# Patient Record
Sex: Male | Born: 2000 | Race: Black or African American | Hispanic: No | Marital: Single | Smoking: Never smoker
Health system: Southern US, Community
[De-identification: ages and names within clinical notes are randomized; demographics above are authoritative.]

## PROBLEM LIST (undated history)

## (undated) ENCOUNTER — Emergency Department (HOSPITAL_COMMUNITY): Admission: EM

## (undated) ENCOUNTER — Ambulatory Visit: Payer: No Typology Code available for payment source

## (undated) DIAGNOSIS — K589 Irritable bowel syndrome without diarrhea: Secondary | ICD-10-CM

## (undated) DIAGNOSIS — M87052 Idiopathic aseptic necrosis of left femur: Secondary | ICD-10-CM

## (undated) DIAGNOSIS — M2142 Flat foot [pes planus] (acquired), left foot: Secondary | ICD-10-CM

## (undated) DIAGNOSIS — R519 Headache, unspecified: Secondary | ICD-10-CM

## (undated) DIAGNOSIS — R569 Unspecified convulsions: Secondary | ICD-10-CM

## (undated) DIAGNOSIS — J45909 Unspecified asthma, uncomplicated: Secondary | ICD-10-CM

## (undated) DIAGNOSIS — K219 Gastro-esophageal reflux disease without esophagitis: Secondary | ICD-10-CM

## (undated) DIAGNOSIS — L309 Dermatitis, unspecified: Secondary | ICD-10-CM

## (undated) DIAGNOSIS — M2141 Flat foot [pes planus] (acquired), right foot: Secondary | ICD-10-CM

## (undated) DIAGNOSIS — J302 Other seasonal allergic rhinitis: Secondary | ICD-10-CM

## (undated) DIAGNOSIS — G43909 Migraine, unspecified, not intractable, without status migrainosus: Secondary | ICD-10-CM

## (undated) DIAGNOSIS — L83 Acanthosis nigricans: Secondary | ICD-10-CM

## (undated) DIAGNOSIS — D509 Iron deficiency anemia, unspecified: Secondary | ICD-10-CM

## (undated) DIAGNOSIS — M24273 Disorder of ligament, unspecified ankle: Secondary | ICD-10-CM

## (undated) DIAGNOSIS — E669 Obesity, unspecified: Secondary | ICD-10-CM

## (undated) HISTORY — DX: Irritable bowel syndrome, unspecified: K58.9

## (undated) HISTORY — DX: Dermatitis, unspecified: L30.9

## (undated) HISTORY — DX: Iron deficiency anemia, unspecified: D50.9

## (undated) HISTORY — PX: COLONOSCOPY: SHX174

## (undated) HISTORY — DX: Flat foot (pes planus) (acquired), right foot: M21.41

## (undated) HISTORY — PX: ADENOIDECTOMY: SUR15

## (undated) HISTORY — DX: Gastro-esophageal reflux disease without esophagitis: K21.9

## (undated) HISTORY — DX: Disorder of ligament, unspecified ankle: M24.273

## (undated) HISTORY — DX: Idiopathic aseptic necrosis of left femur: M87.052

## (undated) HISTORY — DX: Flat foot (pes planus) (acquired), left foot: M21.42

## (undated) HISTORY — DX: Acanthosis nigricans: L83

## (undated) HISTORY — DX: Obesity, unspecified: E66.9

## (undated) HISTORY — DX: Headache, unspecified: R51.9

## (undated) HISTORY — PX: TONSILLECTOMY: SUR1361

## (undated) HISTORY — DX: Migraine, unspecified, not intractable, without status migrainosus: G43.909

---
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
## 2020-01-02 ENCOUNTER — Ambulatory Visit (INDEPENDENT_AMBULATORY_CARE_PROVIDER_SITE_OTHER): Payer: Self-pay | Admitting: Gastroenterology

## 2020-01-02 ENCOUNTER — Encounter: Payer: Self-pay | Admitting: Gastroenterology

## 2020-01-02 VITALS — BP 112/62 | HR 80 | Ht 68.0 in | Wt 283.2 lb

## 2020-01-02 DIAGNOSIS — K580 Irritable bowel syndrome with diarrhea: Secondary | ICD-10-CM

## 2020-01-02 DIAGNOSIS — R140 Abdominal distension (gaseous): Secondary | ICD-10-CM

## 2020-01-02 MED ORDER — DICYCLOMINE HCL 10 MG PO CAPS: 20.0000 mg | ORAL_CAPSULE | Freq: Three times a day (TID) | ORAL | 3 refills | Status: DC

## 2020-01-02 NOTE — Progress Notes (Signed)
[facility] Progress Note  Chief Complaint: Chronic diarrhea  Subjective  History: Seen July 70 consultation with history of IBS-C evaluated and treated at [facility] in 2011 and again in 2016/17.  EGD and colonoscopy done at the most recent evaluation there, negative extensive upper GI and colonic biopsies.  Much of this year he has had diarrhea, which I felt might be evolution of his IBS.  He did not get much improvement with low-dose dicyclomine, so was recently increased.  He also gave me FMLA paperwork to be out of work for a couple of weeks due to the severity of symptoms with urgency and incontinence.  [name] is feeling better in the last few weeks after an increase of the dicyclomine to 20 mg 3 times a day before meals.  He has 2-3 BMs per day that are soft but formed.  There is occasional urgency, but no more accidents.  He also has less stress since quitting his job at an [employer] and getting a new job last week at a [employer] closer to his house. He denies rectal bleeding, his appetite is good and weight stable.  He has no abdominal pain.  ROS: Cardiovascular:  no chest pain Respiratory: no dyspnea  The patient's Past Medical, Family and Social History were reviewed and are on file in the EMR.  Objective:  Med list reviewed  Current Outpatient Medications:  .  albuterol (PROVENTIL HFA;VENTOLIN HFA) 108 (90 BASE) MCG/ACT inhaler, Inhale 1 puff into the lungs every 6 (six) hours as needed for wheezing. , Disp: , Rfl:  .  cetirizine (ZYRTEC) 1 MG/ML syrup, Take 10 mg by mouth at bedtime. , Disp: , Rfl:  .  fluticasone (FLONASE) 50 MCG/ACT nasal spray, Place 2 sprays into the nose daily., Disp: 16 g, Rfl: 1 .  hydrocortisone 2.5 % ointment, APPLY A THIN TO AFFECTED AREA TWICE A DAY FOR MAX OF 7 CONSECUTIVE DAYS EXTERNALLY, Disp: , Rfl:  .  ibuprofen (ADVIL) 200 MG tablet, Take 200 mg by mouth every 6 (six) hours as needed for headache., Disp: , Rfl:  .   polyethylene glycol powder (GLYCOLAX/MIRALAX) powder, 1/2 capful in 8 oz of liquid daily as needed to have 1-2 soft bm, Disp: 255 g, Rfl: 0 .  Rimegepant Sulfate (NURTEC) 75 MG TBDP, Take 75 mg by mouth daily as needed., Disp: 8 tablet, Rfl: 6 .  rizatriptan (MAXALT-MLT) 10 MG disintegrating tablet, Take 1 tablet (10 mg total) by mouth as needed for migraine. May repeat in 2 hours if needed, Disp: 9 tablet, Rfl: 11 .  sodium chloride (OCEAN) 0.65 % SOLN nasal spray, Place 1 spray into both nostrils as needed for congestion., Disp: 88 mL, Rfl: 0 .  topiramate (TOPAMAX) 50 MG tablet, Take 1 tablet (50 mg total) by mouth 2 (two) times daily., Disp: 60 tablet, Rfl: 12 .  dicyclomine (BENTYL) 10 MG capsule, Take 2 capsules (20 mg total) by mouth 3 (three) times daily before meals., Disp: 180 capsule, Rfl: 3   Vital signs in last 24 hrs: Vitals:   01/02/20 1410  BP: 112/62  Pulse: 80  SpO2: 97%    Physical Exam   HEENT: sclera anicteric, oral mucosa moist without lesions  Neck: supple, no thyromegaly, JVD or lymphadenopathy  Cardiac: RRR without murmurs, S1S2 heard, no peripheral edema  Pulm: clear to auscultation bilaterally, normal RR and effort noted  Abdomen: soft, obese, no tenderness, with active bowel sounds. No guarding or palpable hepatosplenomegaly.  Skin; warm and dry, no jaundice or rash  Labs:   ___________________________________________ Radiologic studies:   ____________________________________________ Other:   _____________________________________________ Assessment & Plan  Assessment: Encounter Diagnoses  Name Primary?  . Irritable bowel syndrome with diarrhea Yes  . Abdominal bloating    IBS most of this year, evolution of his previous functional bowel disorder.  Seems to have been at least in part triggered by some stressors.  Doing better lately on increased dose of dicyclomine.  Previous negative testing for IBD and celiac sprue.   Plan: Continue  current dose of dicyclomine, see me in 3 months. Call sooner if necessary.  If not doing well with increased diarrhea, development of abdominal pain or certainly rectal bleeding, then proceed with colonoscopy to see if IBD may have developed.   20 minutes were spent on this encounter (including chart review, history/exam, counseling/coordination of care, and documentation)  Nelida Meuse III

## 2020-01-02 NOTE — Patient Instructions (Signed)
If you are age 19 or older, your body mass index should be between 23-30. Your Body mass index is 43.06 kg/m. If this is out of the aforementioned range listed, please consider follow up with your Primary Care Provider.  If you are age 67 or younger, your body mass index should be between 19-25. Your Body mass index is 43.06 kg/m. If this is out of the aformentioned range listed, please consider follow up with your Primary Care Provider.   It was a pleasure to see you today!  Dr. Loletha Carrow

## 2020-01-07 ENCOUNTER — Encounter: Payer: Self-pay | Admitting: *Deleted

## 2020-01-09 ENCOUNTER — Ambulatory Visit: Payer: Medicaid Other | Admitting: Diagnostic Neuroimaging

## 2020-01-09 ENCOUNTER — Other Ambulatory Visit: Payer: Self-pay

## 2020-01-09 ENCOUNTER — Encounter: Payer: Self-pay | Admitting: Diagnostic Neuroimaging

## 2020-01-09 VITALS — BP 126/79 | HR 81 | Ht 68.0 in | Wt 288.6 lb

## 2020-01-09 DIAGNOSIS — R200 Anesthesia of skin: Secondary | ICD-10-CM
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
## 2020-01-14 ENCOUNTER — Telehealth: Payer: Self-pay | Admitting: Diagnostic Neuroimaging

## 2020-01-14 NOTE — Telephone Encounter (Signed)
[facility] pending.

## 2020-01-16 NOTE — Telephone Encounter (Signed)
I checked the status with RAD MD. It is still pending.

## 2020-01-17 NOTE — Telephone Encounter (Signed)
[facility] [name]: [id number] (exp. 01/14/20 to 02/13/20) order faxed to [facility] because they are who in network with the insurance. They will reach out to the patient to schedule.

## 2020-01-28 ENCOUNTER — Institutional Professional Consult (permissible substitution): Payer: No Typology Code available for payment source | Admitting: Diagnostic Neuroimaging

## 2020-03-30 ENCOUNTER — Other Ambulatory Visit: Payer: Self-pay | Admitting: Gastroenterology

## 2020-03-31 ENCOUNTER — Telehealth: Payer: Self-pay | Admitting: Gastroenterology

## 2020-03-31 NOTE — Telephone Encounter (Signed)
Pt needs Dicyclomine refill, call in at [facility]

## 2020-04-01 MED ORDER — DICYCLOMINE HCL 10 MG PO CAPS: 20.0000 mg | ORAL_CAPSULE | Freq: Three times a day (TID) | ORAL | 3 refills | Status: DC

## 2020-04-01 NOTE — Telephone Encounter (Signed)
Refills have been seen as requested

## 2020-05-01 ENCOUNTER — Encounter: Payer: Self-pay | Admitting: Family Medicine

## 2020-05-01 ENCOUNTER — Ambulatory Visit: Payer: Medicaid Other | Admitting: Family Medicine
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
## 2020-05-01 NOTE — Patient Instructions (Incomplete)
Below is our plan:  We will ***  Please make sure you are staying well hydrated. I recommend 50-60 ounces daily. Well balanced diet and regular exercise encouraged.    Please continue follow up with care team as directed.   Follow up with *** in ***  You may receive a survey regarding today's visit. I encourage you to leave honest feed back as I do use this information to improve patient care. Thank you for seeing me today!      Migraine Headache A migraine headache is a very strong throbbing pain on one side or both sides of your head. This type of headache can also cause other symptoms. It can last from 4 hours to 3 days. Talk with your doctor about what things may bring on (trigger) this condition. What are the causes? The exact cause of this condition is not known. This condition may be triggered or caused by:  Drinking alcohol.  Smoking.  Taking medicines, such as: ? Medicine used to treat chest pain (nitroglycerin). ? Birth control pills. ? Estrogen. ? Some blood pressure medicines.  Eating or drinking certain products.  Doing physical activity. Other things that may trigger a migraine headache include:  Having a menstrual period.  Pregnancy.  Hunger.  Stress.  Not getting enough sleep or getting too much sleep.  Weather changes.  Tiredness (fatigue). What increases the risk?  Being 25-55 years old.  Being male.  Having a family history of migraine headaches.  Being Caucasian.  Having depression or anxiety.  Being very overweight. What are the signs or symptoms?  A throbbing pain. This pain may: ? Happen in any area of the head, such as on one side or both sides. ? Make it hard to do daily activities. ? Get worse with physical activity. ? Get worse around bright lights or loud noises.  Other symptoms may include: ? Feeling sick to your stomach (nauseous). ? Vomiting. ? Dizziness. ? Being sensitive to bright lights, loud noises, or  smells.  Before you get a migraine headache, you may get warning signs (an aura). An aura may include: ? Seeing flashing lights or having blind spots. ? Seeing bright spots, halos, or zigzag lines. ? Having tunnel vision or blurred vision. ? Having numbness or a tingling feeling. ? Having trouble talking. ? Having weak muscles.  Some people have symptoms after a migraine headache (postdromal phase), such as: ? Tiredness. ? Trouble thinking (concentrating). How is this treated?  Taking medicines that: ? Relieve pain. ? Relieve the feeling of being sick to your stomach. ? Prevent migraine headaches.  Treatment may also include: ? Having acupuncture. ? Avoiding foods that bring on migraine headaches. ? Learning ways to control your body functions (biofeedback). ? Therapy to help you know and deal with negative thoughts (cognitive behavioral therapy). Follow these instructions at home: Medicines  Take over-the-counter and prescription medicines only as told by your doctor.  Ask your doctor if the medicine prescribed to you: ? Requires you to avoid driving or using heavy machinery. ? Can cause trouble pooping (constipation). You may need to take these steps to prevent or treat trouble pooping:  Drink enough fluid to keep your pee (urine) pale yellow.  Take over-the-counter or prescription medicines.  Eat foods that are high in fiber. These include beans, whole grains, and fresh fruits and vegetables.  Limit foods that are high in fat and sugar. These include fried or sweet foods. Lifestyle  Do not drink alcohol.  Do   not use any products that contain nicotine or tobacco, such as cigarettes, e-cigarettes, and chewing tobacco. If you need help quitting, ask your doctor.  Get at least 8 hours of sleep every night.  Limit and deal with stress. General instructions  Keep a journal to find out what may bring on your migraine headaches. For example, write down: ? What you eat  and drink. ? How much sleep you get. ? Any change in what you eat or drink. ? Any change in your medicines.  If you have a migraine headache: ? Avoid things that make your symptoms worse, such as bright lights. ? It may help to lie down in a dark, quiet room. ? Do not drive or use heavy machinery. ? Ask your doctor what activities are safe for you.  Keep all follow-up visits as told by your doctor. This is important.      Contact a doctor if:  You get a migraine headache that is different or worse than others you have had.  You have more than 15 headache days in one month. Get help right away if:  Your migraine headache gets very bad.  Your migraine headache lasts longer than 72 hours.  You have a fever.  You have a stiff neck.  You have trouble seeing.  Your muscles feel weak or like you cannot control them.  You start to lose your balance a lot.  You start to have trouble walking.  You pass out (faint).  You have a seizure. Summary  A migraine headache is a very strong throbbing pain on one side or both sides of your head. These headaches can also cause other symptoms.  This condition may be treated with medicines and changes to your lifestyle.  Keep a journal to find out what may bring on your migraine headaches.  Contact a doctor if you get a migraine headache that is different or worse than others you have had.  Contact your doctor if you have more than 15 headache days in a month. This information is not intended to replace advice given to you by your health care provider. Make sure you discuss any questions you have with your health care provider. Document Revised: 07/28/2018 Document Reviewed: 05/18/2018 Elsevier Patient Education  [place].

## 2020-11-05 ENCOUNTER — Other Ambulatory Visit: Payer: Self-pay | Admitting: Gastroenterology

## 2021-01-07 ENCOUNTER — Telehealth: Payer: Self-pay | Admitting: Gastroenterology

## 2021-01-07 ENCOUNTER — Other Ambulatory Visit: Payer: Self-pay | Admitting: Gastroenterology

## 2021-01-07 NOTE — Telephone Encounter (Signed)
Refill has been given, must keep her follow up for additional refills

## 2021-01-07 NOTE — Telephone Encounter (Signed)
Inbound call from patient mother. Need medication refill for dicyclomine to [facility]. Patient have an appt scheduled for 10/12.

## 2021-01-28 ENCOUNTER — Ambulatory Visit (INDEPENDENT_AMBULATORY_CARE_PROVIDER_SITE_OTHER): Payer: Medicaid Other | Admitting: Gastroenterology

## 2021-01-28 ENCOUNTER — Encounter: Payer: Self-pay | Admitting: Gastroenterology

## 2021-01-28 VITALS — BP 120/84 | HR 66 | Ht 69.0 in | Wt 289.0 lb

## 2021-01-28 DIAGNOSIS — K580 Irritable bowel syndrome with diarrhea: Secondary | ICD-10-CM | POA: Diagnosis not present

## 2021-01-28 DIAGNOSIS — K529 Noninfective gastroenteritis and colitis, unspecified: Secondary | ICD-10-CM

## 2021-01-28 DIAGNOSIS — R140 Abdominal distension (gaseous): Secondary | ICD-10-CM

## 2021-01-28 NOTE — Progress Notes (Signed)
[facility] Progress Note  Chief Complaint: Chronic diarrhea and abdominal  Subjective  History:   IBS-D, last seen Sept 2021 on dicyclomine 20 mg TID prn [place] feels about the same as before, medicine helps him most days but he still has bad days with cramps and diarrhea.  He denies rectal bleeding, appetite good and weight stable.  No joint swelling, rash or painful eye redness. He had an endoscopic work-up years ago at [facility], but was having more constipation at that time.  ROS: Cardiovascular:  no chest pain Respiratory: no dyspnea Allergies Headaches Remainder of systems negative except as above The patient's Past Medical, Family and Social History were reviewed and are on file in the EMR.  Objective:  Med list reviewed  Current Outpatient Medications:    albuterol (PROVENTIL HFA;VENTOLIN HFA) 108 (90 BASE) MCG/ACT inhaler, Inhale 1 puff into the lungs every 6 (six) hours as needed for wheezing. , Disp: , Rfl:    cetirizine (ZYRTEC) 1 MG/ML syrup, Take 10 mg by mouth at bedtime. , Disp: , Rfl:    dicyclomine (BENTYL) 10 MG capsule, TAKE 2 CAPSULES (20 MG TOTAL) BY MOUTH 3 (THREE) TIMES DAILY BEFORE MEALS., Disp: 180 capsule, Rfl: 1   fluticasone (FLONASE) 50 MCG/ACT nasal spray, Place 2 sprays into the nose daily., Disp: 16 g, Rfl: 1   hydrocortisone 2.5 % ointment, APPLY A THIN TO AFFECTED AREA TWICE A DAY FOR MAX OF 7 CONSECUTIVE DAYS EXTERNALLY, Disp: , Rfl:    ibuprofen (ADVIL) 200 MG tablet, Take 200 mg by mouth every 6 (six) hours as needed for headache., Disp: , Rfl:    polyethylene glycol powder (GLYCOLAX/MIRALAX) powder, 1/2 capful in 8 oz of liquid daily as needed to have 1-2 soft bm, Disp: 255 g, Rfl: 0   Rimegepant Sulfate (NURTEC) 75 MG TBDP, Take 75 mg by mouth daily as needed., Disp: 8 tablet, Rfl: 6   rizatriptan (MAXALT-MLT) 10 MG disintegrating tablet, Take 1 tablet (10 mg total) by mouth as needed for migraine. May repeat in 2 hours if  needed, Disp: 9 tablet, Rfl: 11   sodium chloride (OCEAN) 0.65 % SOLN nasal spray, Place 1 spray into both nostrils as needed for congestion., Disp: 88 mL, Rfl: 0   topiramate (TOPAMAX) 50 MG tablet, Take 1 tablet (50 mg total) by mouth 2 (two) times daily., Disp: 60 tablet, Rfl: 12   Vital signs in last 24 hrs: Vitals:   01/28/21 1049  BP: 120/84  Pulse: 66  SpO2: 98%   Wt Readings from Last 3 Encounters:  01/28/21 289 lb (131.1 kg)  01/09/20 288 lb 9.6 oz (130.9 kg) (>99 %, Z= 2.93)*  01/02/20 283 lb 3.2 oz (128.5 kg) (>99 %, Z= 2.87)*   * Growth percentiles are based on CDC (Boys, 2-20 Years) data.    Physical Exam  Well-appearing HEENT: sclera anicteric, oral mucosa moist without lesions Neck: supple, no thyromegaly, JVD or lymphadenopathy Cardiac: RRR without murmurs, S1S2 heard, no peripheral edema Pulm: clear to auscultation bilaterally, normal RR and effort noted Abdomen: soft, no tenderness, with active bowel sounds. No guarding or palpable hepatosplenomegaly. Skin; warm and dry, no jaundice or rash  Labs:   ___________________________________________ Radiologic studies:   ____________________________________________ Other:   _____________________________________________ Assessment & Plan  Assessment: Encounter Diagnoses  Name Primary?   Irritable bowel syndrome with diarrhea Yes   Abdominal bloating    Chronic diarrhea    Abdominal bloating, pain that is primarily lower and chronic  constipation, all of which is still most likely IBS.  Years ago he had constipation, then 2 to 3 years ago with change to diarrhea. While antispasmodic therapy might not control all symptoms, given his persistent diarrhea and age we must consider IBD.  No evidence of ileitis on April 2021 CT abdomen pelvis  As such, I recommended he have a colonoscopy to rule out Crohn's or colitis.  He was agreeable after discussion of procedure and risks.  The benefits and risks of the  planned procedure were described in detail with the patient or (when appropriate) their health care proxy.  Risks were outlined as including, but not limited to, bleeding, infection, perforation, adverse medication reaction leading to cardiac or pulmonary decompensation, pancreatitis (if ERCP).  The limitation of incomplete mucosal visualization was also discussed.  No guarantees or warranties were given.  Continue dicyclomine 20 mg 3 times daily as needed in the meantime.    [name]

## 2021-01-28 NOTE — Patient Instructions (Signed)
If you are age 20 or older, your body mass index should be between 23-30. Your Body mass index is 42.68 kg/m. If this is out of the aforementioned range listed, please consider follow up with your Primary Care Provider.  If you are age 80 or younger, your body mass index should be between 19-25. Your Body mass index is 42.68 kg/m. If this is out of the aformentioned range listed, please consider follow up with your Primary Care Provider.   __________________________________________________________  The [facility] providers would like to encourage you to use [facility] to communicate with providers for non-urgent requests or questions.  Due to long hold times on the telephone, sending your provider a message by [facility] may be a faster and more efficient way to get a response.  Please allow 48 business hours for a response.  Please remember that this is for non-urgent requests.   It has been recommended to you by your physician that you have a(n) Colonoscopy completed. Per your request, we did not schedule the procedure(s) today. Please contact our office at [phone] should you decide to have the procedure completed. You will be scheduled for a pre-visit and procedure at that time.  It was a pleasure to see you today!  Thank you for trusting me with your gastrointestinal care!

## 2021-01-29 ENCOUNTER — Other Ambulatory Visit: Payer: Self-pay | Admitting: Gastroenterology

## 2021-02-03 ENCOUNTER — Other Ambulatory Visit: Payer: Self-pay

## 2021-02-03 MED ORDER — DICYCLOMINE HCL 10 MG PO CAPS: 20.0000 mg | ORAL_CAPSULE | Freq: Three times a day (TID) | ORAL | 1 refills | Status: DC

## 2021-02-03 NOTE — Telephone Encounter (Signed)
Resent the Rx from 01-28-2021.

## 2021-02-03 NOTE — Telephone Encounter (Signed)
Patient is requesting another refill of Dicyclomine.

## 2021-02-09 ENCOUNTER — Other Ambulatory Visit: Payer: Self-pay

## 2021-02-09 ENCOUNTER — Ambulatory Visit (AMBULATORY_SURGERY_CENTER): Payer: Medicaid Other

## 2021-02-09 VITALS — Ht 69.0 in | Wt 280.0 lb

## 2021-02-09 DIAGNOSIS — Z1211 Encounter for screening for malignant neoplasm of colon: Secondary | ICD-10-CM

## 2021-02-09 MED ORDER — NA SULFATE-K SULFATE-MG SULF 17.5-3.13-1.6 GM/177ML PO SOLN: 1.0000 | Freq: Once | ORAL | 0 refills | Status: AC

## 2021-02-09 NOTE — Progress Notes (Signed)
Pre visit completed via phone call; Patient verified name, DOB, and address; No egg or soy allergy known to patient  No issues known to pt with past sedation with any surgeries or procedures Patient denies ever being told they had issues or difficulty with intubation  No FH of Malignant Hyperthermia Pt is not on diet pills Pt is not on home 02  Pt is not on blood thinners  Pt denies issues with constipation at this time- hx of constipation -pt reports using Miralax as needed No A fib or A flutter Pt is fully vaccinated for Covid x 2; NO PA's for preps discussed with pt in PV today  Discussed with pt there will be an out-of-pocket cost for prep and that varies from $0 to 70 +  dollars - pt verbalized understanding  Due to the COVID-19 pandemic we are asking patients to follow certain guidelines in PV and the [facility]   Pt aware of COVID protocols and [facility] guidelines

## 2021-02-23 ENCOUNTER — Other Ambulatory Visit: Payer: Self-pay

## 2021-02-23 ENCOUNTER — Encounter: Payer: Self-pay | Admitting: Gastroenterology

## 2021-02-23 ENCOUNTER — Ambulatory Visit (AMBULATORY_SURGERY_CENTER): Payer: Self-pay | Admitting: Gastroenterology

## 2021-02-23 VITALS — BP 109/64 | HR 75 | Temp 97.1°F | Resp 12 | Ht 69.0 in | Wt 280.0 lb

## 2021-02-23 DIAGNOSIS — K5140 Inflammatory polyps of colon without complications: Secondary | ICD-10-CM

## 2021-02-23 DIAGNOSIS — K529 Noninfective gastroenteritis and colitis, unspecified: Secondary | ICD-10-CM

## 2021-02-23 DIAGNOSIS — R1030 Lower abdominal pain, unspecified: Secondary | ICD-10-CM

## 2021-02-23 DIAGNOSIS — D125 Benign neoplasm of sigmoid colon: Secondary | ICD-10-CM

## 2021-02-23 MED ORDER — SODIUM CHLORIDE 0.9 % IV SOLN
500.0000 mL | Freq: Once | INTRAVENOUS | Status: DC
Start: 2021-02-23 — End: 2021-02-23

## 2021-02-23 NOTE — Progress Notes (Signed)
PT taken to PACU. Monitors in place. VSS. Report given to RN. 

## 2021-02-23 NOTE — Progress Notes (Signed)
Pt's states no medical or surgical changes since previsit or office visit. VS by DT. °

## 2021-02-23 NOTE — Op Note (Signed)
[facility] Patient Name: [name] Procedure Date: 02/23/2021 3:09 PM MRN: [id number] Endoscopist: Mallie Mussel L. Loletha Carrow , MD Age: 20 Referring MD:  Date of Birth: 06-22-2000 Gender: Male Account #: [id number] Procedure:                Colonoscopy Indications:              Lower abdominal pain, Chronic diarrhea                           clinical suspicion IBS, on dicyclomine. exam to r/o                            IBD Medicines:                Monitored Anesthesia Care Procedure:                Pre-Anesthesia Assessment:                           - Prior to the procedure, a History and Physical                            was performed, and patient medications and                            allergies were reviewed. The patient's tolerance of                            previous anesthesia was also reviewed. The risks                            and benefits of the procedure and the sedation                            options and risks were discussed with the patient.                            All questions were answered, and informed consent                            was obtained. Prior Anticoagulants: The patient has                            taken no previous anticoagulant or antiplatelet                            agents. ASA Grade Assessment: III - A patient with                            severe systemic disease. After reviewing the risks                            and benefits, the patient was deemed in  satisfactory condition to undergo the procedure.                           After obtaining informed consent, the colonoscope                            was passed under direct vision. Throughout the                            procedure, the patient's blood pressure, pulse, and                            oxygen saturations were monitored continuously. The                            #[id number] was introduced through the anus                             and advanced to the the terminal ileum, with                            identification of the appendiceal orifice and IC                            valve. The colonoscopy was performed without                            difficulty. The patient tolerated the procedure                            well. The quality of the bowel preparation was                            good. The terminal ileum, ileocecal valve,                            appendiceal orifice, and rectum were photographed. Scope In: 3:39:02 PM Scope Out: 3:51:36 PM Scope Withdrawal Time: 0 hours 10 minutes 2 seconds  Total Procedure Duration: 0 hours 12 minutes 34 seconds  Findings:                 The perianal and digital rectal examinations were                            normal.                           The terminal ileum appeared normal.                           Normal mucosa was found in the entire colon.                            Biopsies for histology were taken with a cold  forceps from the right colon and left colon for                            evaluation of microscopic colitis.                           A 6-8 mm polyp was found in the sigmoid colon. The                            polyp was pedunculated. The polyp was removed with                            a hot snare. Resection and retrieval were complete.                            (probable inflammatory polyp by appearance)                           The exam was otherwise without abnormality on                            direct and retroflexion views. Complications:            No immediate complications. Estimated Blood Loss:     Estimated blood loss was minimal. Impression:               - The examined portion of the ileum was normal.                           - Normal mucosa in the entire examined colon.                            Biopsied.                           - One 6-6 mm polyp in the sigmoid colon, removed                             with a hot snare. Resected and retrieved.                           - The examination was otherwise normal on direct                            and retroflexion views. Recommendation:           - Patient has a contact number available for                            emergencies. The signs and symptoms of potential                            delayed complications were discussed with the  patient. Return to normal activities tomorrow.                            Written discharge instructions were provided to the                            patient.                           - Resume previous diet.                           - Continue present medications.                           - Await pathology results.                           - Return to my office in 3 months for f/u of IBS. Arista Kettlewell L. Loletha Carrow, MD 02/23/2021 3:56:27 PM This report has been signed electronically.

## 2021-02-23 NOTE — Patient Instructions (Signed)
Handout on polyps given. ° °YOU HAD AN ENDOSCOPIC PROCEDURE TODAY AT THE [facility]:   Refer to the procedure report that was given to you for any specific questions about what was found during the examination.  If the procedure report does not answer your questions, please call your gastroenterologist to clarify.  If you requested that your care partner not be given the details of your procedure findings, then the procedure report has been included in a sealed envelope for you to review at your convenience later. ° °YOU SHOULD EXPECT: Some feelings of bloating in the abdomen. Passage of more gas than usual.  Walking can help get rid of the air that was put into your GI tract during the procedure and reduce the bloating. If you had a lower endoscopy (such as a colonoscopy or flexible sigmoidoscopy) you may notice spotting of blood in your stool or on the toilet paper. If you underwent a bowel prep for your procedure, you may not have a normal bowel movement for a few days. ° °Please Note:  You might notice some irritation and congestion in your nose or some drainage.  This is from the oxygen used during your procedure.  There is no need for concern and it should clear up in a day or so. ° °SYMPTOMS TO REPORT IMMEDIATELY: ° °Following lower endoscopy (colonoscopy or flexible sigmoidoscopy): ° Excessive amounts of blood in the stool ° Significant tenderness or worsening of abdominal pains ° Swelling of the abdomen that is new, acute ° Fever of 100°F or higher ° °For urgent or emergent issues, a gastroenterologist can be reached at any hour by calling [phone]. °Do not use MyChart messaging for urgent concerns.  ° ° °DIET:  We do recommend a small meal at first, but then you may proceed to your regular diet.  Drink plenty of fluids but you should avoid alcoholic beverages for 24 hours. ° °ACTIVITY:  You should plan to take it easy for the rest of today and you should NOT DRIVE or use heavy machinery  until tomorrow (because of the sedation medicines used during the test).   ° °FOLLOW UP: °Our staff will call the number listed on your records 48-72 hours following your procedure to check on you and address any questions or concerns that you may have regarding the information given to you following your procedure. If we do not reach you, we will leave a message.  We will attempt to reach you two times.  During this call, we will ask if you have developed any symptoms of COVID 19. If you develop any symptoms (ie: fever, flu-like symptoms, shortness of breath, cough etc.) before then, please call [phone].  If you test positive for Covid 19 in the 2 weeks post procedure, please call and report this information to us.   ° °If any biopsies were taken you will be contacted by phone or by letter within the next 1-3 weeks.  Please call us at [phone] if you have not heard about the biopsies in 3 weeks.  ° ° °SIGNATURES/CONFIDENTIALITY: °You and/or your care partner have signed paperwork which will be entered into your electronic medical record.  These signatures attest to the fact that that the information above on your After Visit Summary has been reviewed and is understood.  Full responsibility of the confidentiality of this discharge information lies with you and/or your care-partner.  °

## 2021-02-23 NOTE — Progress Notes (Signed)
No changes to clinical history since GI office visit on 01/28/21.  The patient is appropriate for an endoscopic procedure in the ambulatory setting.

## 2021-02-24 ENCOUNTER — Telehealth: Payer: Self-pay

## 2021-02-24 NOTE — Telephone Encounter (Signed)
Per 02/23/21 procedure report - Return to my office in 64-month for f/u up of IBS  February schedule is currently not available. 35-month office recall in epic.

## 2021-02-25 ENCOUNTER — Telehealth: Payer: Self-pay

## 2021-02-25 ENCOUNTER — Other Ambulatory Visit: Payer: Self-pay | Admitting: Gastroenterology

## 2021-02-25 NOTE — Telephone Encounter (Signed)
No answer, left message to call back later today, B.Peyson Delao RN. 

## 2021-03-02 ENCOUNTER — Encounter: Payer: Self-pay | Admitting: Gastroenterology

## 2021-03-29 ENCOUNTER — Other Ambulatory Visit: Payer: Self-pay | Admitting: Gastroenterology

## 2021-04-27 ENCOUNTER — Other Ambulatory Visit: Payer: Self-pay | Admitting: Gastroenterology

## 2021-05-06 ENCOUNTER — Encounter: Payer: Self-pay | Admitting: Gastroenterology

## 2021-05-23 ENCOUNTER — Other Ambulatory Visit: Payer: Self-pay | Admitting: Gastroenterology
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
## 2021-06-19 ENCOUNTER — Other Ambulatory Visit: Payer: Self-pay | Admitting: Gastroenterology

## 2021-07-13 ENCOUNTER — Other Ambulatory Visit: Payer: Self-pay | Admitting: Gastroenterology

## 2021-08-24 ENCOUNTER — Other Ambulatory Visit: Payer: Self-pay | Admitting: Gastroenterology

## 2021-08-25 ENCOUNTER — Other Ambulatory Visit: Payer: Self-pay | Admitting: Gastroenterology

## 2021-08-25 DIAGNOSIS — K580 Irritable bowel syndrome with diarrhea: Secondary | ICD-10-CM

## 2021-08-25 MED ORDER — DICYCLOMINE HCL 20 MG PO TABS: 20.0000 mg | ORAL_TABLET | Freq: Three times a day (TID) | ORAL | 1 refills | Status: DC

## 2021-08-27 ENCOUNTER — Other Ambulatory Visit: Payer: Self-pay | Admitting: Gastroenterology

## 2022-04-21 ENCOUNTER — Other Ambulatory Visit: Payer: Self-pay | Admitting: Gastroenterology

## 2022-04-21 NOTE — Telephone Encounter (Signed)
Pharmacy is requesting refill for  bentyl for this patient. You saw him last time on 02/07/21. I called the patient and scheduled his appt with you on 05/04/22. Patient stating he is out of bentyl and requesting to sent refill.  Please advise. Thank you!

## 2022-05-04 ENCOUNTER — Ambulatory Visit: Payer: Medicaid Other | Admitting: Gastroenterology

## 2022-05-04 ENCOUNTER — Encounter: Payer: Self-pay | Admitting: Gastroenterology

## 2022-05-04 ENCOUNTER — Other Ambulatory Visit: Payer: Medicaid Other

## 2022-05-04 VITALS — BP 120/82 | HR 94 | Ht 69.5 in | Wt 246.4 lb

## 2022-05-04 DIAGNOSIS — K580 Irritable bowel syndrome with diarrhea: Secondary | ICD-10-CM

## 2022-05-04 MED ORDER — DICYCLOMINE HCL 20 MG PO TABS: 20.0000 mg | ORAL_TABLET | Freq: Three times a day (TID) | ORAL | 1 refills | Status: DC

## 2022-05-04 NOTE — Progress Notes (Signed)
     [facility] Progress Note  Chief Complaint: IBS-D  Subjective  History: Summary of GI issues: IBS with lower abdominal pain, bloating and diarrhea.  Treated with dicyclomine.  Colonoscopy with normal colon biopsies November 2022.  Damoyne continues to take dicyclomine 20 mg 3 times daily but generally controls cramps and diarrhea.  However, some days he still has semiformed to loose nonbloody stool and cannot seem to attribute it to any particular foods or other triggers.  (15 minutes late for today's visit) ROS: Cardiovascular:  no chest pain Respiratory: no dyspnea  The patient's Past Medical, Family and Social History were reviewed and are on file in the EMR.  Objective:  Med list reviewed  Current Outpatient Medications:    albuterol (PROVENTIL HFA;VENTOLIN HFA) 108 (90 BASE) MCG/ACT inhaler, Inhale 1 puff into the lungs every 6 (six) hours as needed for wheezing. , Disp: , Rfl:    cetirizine (ZYRTEC) 10 MG tablet, Take 1 tablet by mouth daily at 6 (six) AM., Disp: , Rfl:    fluticasone (FLONASE) 50 MCG/ACT nasal spray, Place 2 sprays into the nose daily., Disp: 16 g, Rfl: 1   hydrocortisone 2.5 % ointment, daily as needed., Disp: , Rfl:    ibuprofen (ADVIL) 200 MG tablet, Take 200 mg by mouth every 6 (six) hours as needed for headache., Disp: , Rfl:    rizatriptan (MAXALT-MLT) 10 MG disintegrating tablet, Take 1 tablet (10 mg total) by mouth as needed for migraine. May repeat in 2 hours if needed, Disp: 9 tablet, Rfl: 11   sodium chloride (OCEAN) 0.65 % SOLN nasal spray, Place 1 spray into both nostrils as needed for congestion., Disp: 88 mL, Rfl: 0   dicyclomine (BENTYL) 20 MG tablet, Take 1 tablet (20 mg total) by mouth 3 (three) times daily before meals., Disp: 270 tablet, Rfl: 1   Vital signs in last 24 hrs: Vitals:   05/04/22 0836  BP: 120/82  Pulse: 94  SpO2: 98%   Wt Readings from Last 3 Encounters:  05/04/22 246 lb 6 oz (111.8 kg)  02/23/21 280 lb  (127 kg)  02/09/21 280 lb (127 kg)    Physical Exam  Well-appearing  Cardiac: Regular without appreciable murmur,  no peripheral edema Pulm: clear to auscultation bilaterally, normal RR and effort noted Abdomen: soft, no tenderness, with active bowel sounds. No guarding or palpable hepatosplenomegaly.  Labs:   ___________________________________________ Radiologic studies:   ____________________________________________ Other:   _____________________________________________ Assessment & Plan  Assessment: Encounter Diagnosis  Name Primary?   Irritable bowel syndrome with diarrhea Yes   Intermittent breakthrough symptoms.  Would like to try hyoscyamine instead but Medicaid does not cover it.  Recommend using as needed Imodium in addition to the dicyclomine   Plan: Celiac antibodies since not done previously  20 minutes were spent on this encounter (including chart review, history/exam, counseling/coordination of care, and documentation) > 50% of that time was spent on counseling and coordination of care.   Nelida Meuse III

## 2022-05-04 NOTE — Patient Instructions (Signed)
_______________________________________________________  If your blood pressure at your visit was 140/90 or greater, please contact your primary care physician to follow up on this.  _______________________________________________________  If you are age 23 or older, your body mass index should be between 23-30. Your Body mass index is 35.86 kg/m. If this is out of the aforementioned range listed, please consider follow up with your Primary Care Provider.  If you are age 2 or younger, your body mass index should be between 19-25. Your Body mass index is 35.86 kg/m. If this is out of the aformentioned range listed, please consider follow up with your Primary Care Provider.   ________________________________________________________  The [facility] providers would like to encourage you to use [facility] to communicate with providers for non-urgent requests or questions.  Due to long hold times on the telephone, sending your provider a message by [facility] may be a faster and more efficient way to get a response.  Please allow 48 business hours for a response.  Please remember that this is for non-urgent requests.  _______________________________________________________  Your provider has requested that you go to the basement level for lab work before leaving today. Press "B" on the elevator. The lab is located at the first door on the left as you exit the elevator.  Due to recent changes in healthcare laws, you may see the results of your imaging and laboratory studies on MyChart before your provider has had a chance to review them.  We understand that in some cases there may be results that are confusing or concerning to you. Not all laboratory results come back in the same time frame and the provider may be waiting for multiple results in order to interpret others.  Please give [place] 48 hours in order for your provider to thoroughly review all the results before contacting the office for clarification of  your results.   It was a pleasure to see you today!  Thank you for trusting me with your gastrointestinal care!

## 2022-05-05 LAB — IGA: Immunoglobulin A: 12 mg/dL — ABNORMAL LOW (ref 47–310)

## 2022-05-05 LAB — TISSUE TRANSGLUTAMINASE, IGA

## 2022-05-11 ENCOUNTER — Other Ambulatory Visit: Payer: Self-pay

## 2022-05-11 DIAGNOSIS — K580 Irritable bowel syndrome with diarrhea: Secondary | ICD-10-CM

## 2022-05-12 NOTE — Addendum Note (Signed)
Addended by: Yevette Edwards on: 05/12/2022 09:14 AM   Modules accepted: Orders

## 2022-11-27 ENCOUNTER — Other Ambulatory Visit: Payer: Self-pay | Admitting: Gastroenterology

## 2023-02-13 ENCOUNTER — Ambulatory Visit
Admission: EM | Admit: 2023-02-13 | Discharge: 2023-02-13 | Disposition: A | Discharge status: Home / Self Care | Payer: Medicaid Other | Attending: Internal Medicine | Admitting: Internal Medicine

## 2023-02-13 DIAGNOSIS — J4530 Mild persistent asthma, uncomplicated: Secondary | ICD-10-CM

## 2023-02-13 DIAGNOSIS — J40 Bronchitis, not specified as acute or chronic: Secondary | ICD-10-CM

## 2023-02-13 DIAGNOSIS — J329 Chronic sinusitis, unspecified: Secondary | ICD-10-CM

## 2023-02-13 DIAGNOSIS — J309 Allergic rhinitis, unspecified: Secondary | ICD-10-CM

## 2023-02-13 MED ORDER — PREDNISONE 20 MG PO TABS: ORAL_TABLET | ORAL | 0 refills | Status: AC

## 2023-02-13 MED ORDER — DOXYCYCLINE HYCLATE 100 MG PO CAPS: 100.0000 mg | ORAL_CAPSULE | Freq: Two times a day (BID) | ORAL | 0 refills | Status: AC

## 2023-02-13 MED ORDER — ALBUTEROL SULFATE HFA 108 (90 BASE) MCG/ACT IN AERS: 1.0000 | INHALATION_SPRAY | Freq: Four times a day (QID) | RESPIRATORY_TRACT | 0 refills | Status: AC | PRN

## 2023-02-13 MED ORDER — PROMETHAZINE-DM 6.25-15 MG/5ML PO SYRP: 5.0000 mL | ORAL_SOLUTION | Freq: Three times a day (TID) | ORAL | 0 refills | Status: AC | PRN

## 2023-02-13 NOTE — ED Triage Notes (Signed)
Pt c/o prod cough, head/chest congestion, n/v/d and HA- sx started 1 week ago-last dose ibuprofen ~1 hour PTA-NAD-steady gait

## 2023-02-13 NOTE — Discharge Instructions (Signed)
Start doxycycline and prednisone to help with your sinobronchitis. Use the albuterol inhaler as needed. Get plenty of fluids, rest, use Tylenol for aches and pains.
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
## 2023-08-17 ENCOUNTER — Other Ambulatory Visit

## 2023-08-17 ENCOUNTER — Ambulatory Visit: Admitting: Physician Assistant

## 2023-08-17 ENCOUNTER — Encounter: Payer: Self-pay | Admitting: Physician Assistant

## 2023-08-17 VITALS — BP 126/68 | HR 110 | Ht 69.0 in | Wt 298.0 lb

## 2023-08-17 DIAGNOSIS — K580 Irritable bowel syndrome with diarrhea: Secondary | ICD-10-CM

## 2023-08-17 MED ORDER — DICYCLOMINE HCL 20 MG PO TABS: 20.0000 mg | ORAL_TABLET | Freq: Three times a day (TID) | ORAL | 11 refills | Status: AC

## 2023-08-17 NOTE — Patient Instructions (Signed)
 Your provider has requested that you go to the basement level for lab work before leaving today. Press "B" on the elevator. The lab is located at the first door on the left as you exit the elevator.  We have sent the following medications to your pharmacy for you to pick up at your convenience: Dicyclomine  20 mg four times daily 20-30 minutes before meals and bedtime.   Continue Loperamide as needed.  _______________________________________________________  If your blood pressure at your visit was 140/90 or greater, please contact your primary care physician to follow up on this.  _______________________________________________________  If you are age 40 or older, your body mass index should be between 23-30. Your Body mass index is 44.01 kg/m. If this is out of the aforementioned range listed, please consider follow up with your Primary Care Provider.  If you are age 35 or younger, your body mass index should be between 19-25. Your Body mass index is 44.01 kg/m. If this is out of the aformentioned range listed, please consider follow up with your Primary Care Provider.   ________________________________________________________  The [facility] providers would like to encourage you to use MYCHART to communicate with providers for non-urgent requests or questions.  Due to long hold times on the telephone, sending your provider a message by [facility] may be a faster and more efficient way to get a response.  Please allow 48 business hours for a response.  Please remember that this is for non-urgent requests.  _______________________________________________________
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
## 2023-08-18 LAB — GLIADIN DEAMIDATED PEPT AB,IGA: Gliadin IgA: <1 U/mL

## 2023-08-18 NOTE — Progress Notes (Signed)
 ____________________________________________________________  Attending physician addendum:  Thank you for sending this case to me. I have reviewed the entire note and agree with the plan.  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  Lorella Roles, MD  ____________________________________________________________

## 2024-02-20 ENCOUNTER — Ambulatory Visit
Admission: EM | Admit: 2024-02-20 | Discharge: 2024-02-20 | Disposition: A | Discharge status: Home / Self Care | Attending: Family Medicine | Admitting: Family Medicine

## 2024-02-20 DIAGNOSIS — A084 Viral intestinal infection, unspecified: Secondary | ICD-10-CM | POA: Diagnosis not present

## 2024-02-20 DIAGNOSIS — R197 Diarrhea, unspecified: Secondary | ICD-10-CM

## 2024-02-20 DIAGNOSIS — R112 Nausea with vomiting, unspecified: Secondary | ICD-10-CM

## 2024-02-20 MED ORDER — LOPERAMIDE HCL 2 MG PO CAPS: 2.0000 mg | ORAL_CAPSULE | Freq: Two times a day (BID) | ORAL | 0 refills | Status: AC | PRN

## 2024-02-20 MED ORDER — ONDANSETRON 8 MG PO TBDP: 8.0000 mg | ORAL_TABLET | Freq: Three times a day (TID) | ORAL | 0 refills | Status: AC | PRN

## 2024-02-20 NOTE — ED Triage Notes (Signed)
 Pt c/o abd pain, diarrhea x 3 days-n/v x today-HA x 2 days-NAD-steady gait

## 2024-02-20 NOTE — Discharge Instructions (Addendum)
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
